# Patient Record
Sex: Female | Born: 1960 | Race: Black or African American | Hispanic: No | Marital: Married | Smoking: Former smoker
Health system: Southern US, Community
[De-identification: ages and names within clinical notes are randomized; demographics above are authoritative.]

## PROBLEM LIST (undated history)

## (undated) DIAGNOSIS — K219 Gastro-esophageal reflux disease without esophagitis: Secondary | ICD-10-CM

## (undated) DIAGNOSIS — Z780 Asymptomatic menopausal state: Secondary | ICD-10-CM

## (undated) DIAGNOSIS — I10 Essential (primary) hypertension: Secondary | ICD-10-CM

## (undated) DIAGNOSIS — R7303 Prediabetes: Secondary | ICD-10-CM

## (undated) DIAGNOSIS — E782 Mixed hyperlipidemia: Secondary | ICD-10-CM

## (undated) HISTORY — DX: Essential (primary) hypertension: I10

## (undated) HISTORY — DX: Gastro-esophageal reflux disease without esophagitis: K21.9

## (undated) HISTORY — DX: Mixed hyperlipidemia: E78.2

## (undated) HISTORY — DX: Prediabetes: R73.03

## (undated) HISTORY — DX: Asymptomatic menopausal state: Z78.0

## (undated) HISTORY — PX: HERNIA REPAIR: SHX51

---

## 1999-07-18 ENCOUNTER — Ambulatory Visit (HOSPITAL_COMMUNITY): Admission: RE | Admit: 1999-07-18 | Discharge: 1999-07-18 | Payer: Self-pay | Admitting: Internal Medicine

## 1999-07-18 ENCOUNTER — Encounter: Payer: Self-pay | Admitting: Internal Medicine

## 2000-01-22 ENCOUNTER — Other Ambulatory Visit: Admission: RE | Admit: 2000-01-22 | Discharge: 2000-01-22 | Payer: Self-pay | Admitting: Internal Medicine

## 2000-04-02 ENCOUNTER — Encounter: Payer: Self-pay | Admitting: Internal Medicine

## 2000-04-02 ENCOUNTER — Ambulatory Visit (HOSPITAL_COMMUNITY): Admission: RE | Admit: 2000-04-02 | Discharge: 2000-04-02 | Payer: Self-pay | Admitting: Internal Medicine

## 2000-04-06 ENCOUNTER — Ambulatory Visit (HOSPITAL_COMMUNITY): Admission: RE | Admit: 2000-04-06 | Discharge: 2000-04-06 | Payer: Self-pay | Admitting: Internal Medicine

## 2000-04-06 ENCOUNTER — Encounter: Payer: Self-pay | Admitting: Internal Medicine

## 2000-06-23 ENCOUNTER — Ambulatory Visit (HOSPITAL_COMMUNITY): Admission: RE | Admit: 2000-06-23 | Discharge: 2000-06-23 | Payer: Self-pay | Admitting: Neurology

## 2001-07-17 ENCOUNTER — Other Ambulatory Visit: Admission: RE | Admit: 2001-07-17 | Discharge: 2001-07-17 | Payer: Self-pay | Admitting: Family Medicine

## 2003-01-05 ENCOUNTER — Emergency Department (HOSPITAL_COMMUNITY): Admission: EM | Admit: 2003-01-05 | Discharge: 2003-01-06 | Payer: Self-pay | Admitting: Emergency Medicine

## 2003-01-06 ENCOUNTER — Encounter: Payer: Self-pay | Admitting: Emergency Medicine

## 2005-07-16 ENCOUNTER — Emergency Department (HOSPITAL_COMMUNITY): Admission: EM | Admit: 2005-07-16 | Discharge: 2005-07-16 | Payer: Self-pay | Admitting: Emergency Medicine

## 2006-01-06 ENCOUNTER — Ambulatory Visit: Payer: Self-pay | Admitting: Internal Medicine

## 2006-01-09 ENCOUNTER — Ambulatory Visit: Payer: Self-pay | Admitting: Internal Medicine

## 2006-06-18 ENCOUNTER — Other Ambulatory Visit: Admission: RE | Admit: 2006-06-18 | Discharge: 2006-06-18 | Payer: Self-pay | Admitting: Obstetrics and Gynecology

## 2006-08-18 ENCOUNTER — Ambulatory Visit: Payer: Self-pay | Admitting: Internal Medicine

## 2006-09-19 ENCOUNTER — Ambulatory Visit: Payer: Self-pay | Admitting: Internal Medicine

## 2006-09-19 LAB — CONVERTED CEMR LAB
BUN: 11 mg/dL (ref 6–23)
Chol/HDL Ratio, serum: 4.6
Cholesterol: 166 mg/dL (ref 0–200)
Creatinine, Ser: 0.8 mg/dL (ref 0.4–1.2)
Creatinine,U: 179.1 mg/dL
HDL: 36.4 mg/dL — ABNORMAL LOW (ref >39.0)
Hgb A1c MFr Bld: 5.8 % (ref 4.6–6.0)
LDL Cholesterol: 120 mg/dL — ABNORMAL HIGH (ref 0–99)
Microalb Creat Ratio: 2.8 mg/g (ref 0.0–30.0)
Microalb, Ur: 0.5 mg/dL (ref 0.0–1.9)
Potassium: 4.1 meq/L (ref 3.5–5.1)
Triglyceride fasting, serum: 47 mg/dL (ref 0–149)
VLDL: 9 mg/dL (ref 0–40)

## 2006-10-07 ENCOUNTER — Ambulatory Visit: Payer: Self-pay | Admitting: Internal Medicine

## 2007-05-19 ENCOUNTER — Ambulatory Visit: Payer: Self-pay | Admitting: Internal Medicine

## 2007-06-02 ENCOUNTER — Telehealth (INDEPENDENT_AMBULATORY_CARE_PROVIDER_SITE_OTHER): Payer: Self-pay | Admitting: *Deleted

## 2007-06-02 DIAGNOSIS — R002 Palpitations: Secondary | ICD-10-CM | POA: Insufficient documentation

## 2007-06-29 ENCOUNTER — Ambulatory Visit: Payer: Self-pay | Admitting: Cardiology

## 2007-06-29 LAB — CONVERTED CEMR LAB: TSH: 1.04 microintl units/mL (ref 0.35–5.50)

## 2007-07-09 ENCOUNTER — Ambulatory Visit: Payer: Self-pay

## 2007-09-04 ENCOUNTER — Ambulatory Visit: Payer: Self-pay | Admitting: Cardiology

## 2007-09-15 ENCOUNTER — Telehealth (INDEPENDENT_AMBULATORY_CARE_PROVIDER_SITE_OTHER): Payer: Self-pay | Admitting: *Deleted

## 2007-11-09 ENCOUNTER — Telehealth: Payer: Self-pay | Admitting: Internal Medicine

## 2007-11-11 ENCOUNTER — Ambulatory Visit: Payer: Self-pay | Admitting: Cardiology

## 2007-11-23 ENCOUNTER — Ambulatory Visit: Payer: Self-pay | Admitting: Cardiology

## 2008-01-25 ENCOUNTER — Ambulatory Visit: Payer: Self-pay | Admitting: Cardiology

## 2008-01-25 LAB — CONVERTED CEMR LAB
BUN: 10 mg/dL (ref 6–23)
CO2: 29 meq/L (ref 19–32)
Calcium: 9.1 mg/dL (ref 8.4–10.5)
Chloride: 107 meq/L (ref 96–112)
Creatinine, Ser: 0.7 mg/dL (ref 0.4–1.2)
GFR calc Af Amer: 116 mL/min
GFR calc non Af Amer: 96 mL/min
Glucose, Bld: 106 mg/dL — ABNORMAL HIGH (ref 70–99)
Potassium: 3.8 meq/L (ref 3.5–5.1)
Sodium: 141 meq/L (ref 135–145)

## 2008-02-16 ENCOUNTER — Encounter: Payer: Self-pay | Admitting: Internal Medicine

## 2008-03-29 ENCOUNTER — Telehealth (INDEPENDENT_AMBULATORY_CARE_PROVIDER_SITE_OTHER): Payer: Self-pay | Admitting: *Deleted

## 2008-04-22 ENCOUNTER — Ambulatory Visit: Payer: Self-pay | Admitting: Internal Medicine

## 2008-04-22 DIAGNOSIS — Z8679 Personal history of other diseases of the circulatory system: Secondary | ICD-10-CM | POA: Insufficient documentation

## 2008-04-22 DIAGNOSIS — E8881 Metabolic syndrome: Secondary | ICD-10-CM

## 2008-04-22 DIAGNOSIS — I10 Essential (primary) hypertension: Secondary | ICD-10-CM | POA: Insufficient documentation

## 2008-05-03 ENCOUNTER — Telehealth: Payer: Self-pay | Admitting: Internal Medicine

## 2008-05-03 ENCOUNTER — Encounter (INDEPENDENT_AMBULATORY_CARE_PROVIDER_SITE_OTHER): Payer: Self-pay | Admitting: *Deleted

## 2008-05-03 LAB — CONVERTED CEMR LAB
BUN: 12 mg/dL (ref 6–23)
Creatinine, Ser: 0.86 mg/dL (ref 0.40–1.20)
Hgb A1c MFr Bld: 6 % (ref 4.6–6.1)
Potassium: 3.9 meq/L (ref 3.5–5.3)

## 2008-05-16 ENCOUNTER — Telehealth (INDEPENDENT_AMBULATORY_CARE_PROVIDER_SITE_OTHER): Payer: Self-pay | Admitting: *Deleted

## 2009-03-02 ENCOUNTER — Ambulatory Visit: Payer: Self-pay | Admitting: Family Medicine

## 2009-03-02 DIAGNOSIS — H109 Unspecified conjunctivitis: Secondary | ICD-10-CM | POA: Insufficient documentation

## 2010-01-19 ENCOUNTER — Ambulatory Visit: Payer: Self-pay | Admitting: Diagnostic Radiology

## 2010-10-11 ENCOUNTER — Emergency Department (HOSPITAL_BASED_OUTPATIENT_CLINIC_OR_DEPARTMENT_OTHER): Admission: EM | Admit: 2010-10-11 | Discharge: 2010-01-19 | Payer: Self-pay | Admitting: Emergency Medicine

## 2011-01-28 LAB — COMPREHENSIVE METABOLIC PANEL
ALT: 20 U/L (ref 0–35)
AST: 42 U/L — ABNORMAL HIGH (ref 0–37)
Albumin: 3.5 g/dL (ref 3.5–5.2)
Alkaline Phosphatase: 81 U/L (ref 39–117)
CO2: 29 mEq/L (ref 19–32)
Chloride: 108 mEq/L (ref 96–112)
GFR calc Af Amer: >60 mL/min (ref >60)
GFR calc non Af Amer: >60 mL/min (ref >60)
Potassium: 4.1 mEq/L (ref 3.5–5.1)
Sodium: 145 mEq/L (ref 135–145)
Total Bilirubin: 0.8 mg/dL (ref 0.3–1.2)

## 2011-01-28 LAB — CBC
Platelets: 233 10*3/uL (ref 150–400)
RBC: 4.62 MIL/uL (ref 3.87–5.11)
WBC: 5.8 10*3/uL (ref 4.0–10.5)

## 2011-01-28 LAB — DIFFERENTIAL
Basophils Absolute: 0.1 10*3/uL (ref 0.0–0.1)
Basophils Relative: 1 % (ref 0–1)
Eosinophils Absolute: 0.3 10*3/uL (ref 0.0–0.7)
Eosinophils Relative: 5 % (ref 0–5)
Lymphocytes Relative: 42 % (ref 12–46)
Monocytes Absolute: 0.3 10*3/uL (ref 0.1–1.0)

## 2011-01-28 LAB — POCT CARDIAC MARKERS: CKMB, poc: <1 ng/mL — ABNORMAL LOW (ref 1.0–8.0)

## 2011-03-19 NOTE — Assessment & Plan Note (Signed)
[facility] HEALTHCARE                            CARDIOLOGY OFFICE NOTE   Margaret Mcmahon, Margaret Mcmahon                       MRN:          [id number]  DATE:11/23/2007                            DOB:          02-07-1961    Margaret Mcmahon is a pleasant 50 year old female whom I have seen for  palpitations.  Her LV function is normal and previous TSH was normal.  She also has significant hypertension that we are treating.  Since I  last saw her she feels well with no dyspnea, chest pain, palpitations,  or syncope.  She is checking her blood pressure routinely at home, and  her systolic is in 150-170 range and her diastolic is approximately 100.   MEDICATIONS INCLUDE:  1. Lisinopril 40 mg daily.  2. Spironolactone 25 mg p.o. b.i.d.  3. Aspirin 81 mg daily.  4. Toprol 50 mg p.o. b.i.d.   PHYSICAL EXAM:  Today shows a blood pressure of 163/99 and pulse of 63.  She weighs 108 pounds.  HEENT:  Normal.  NECK:  Supple.  CHEST:  Clear.  CARDIOVASCULAR EXAM:  Regular rhythm.  ABDOMINAL EXAM:  Shows no tenderness.  EXTREMITIES:  Show no edema.   DIAGNOSES:  1. Palpitations.  These have improved on the Toprol.  2. Hypertension.  Her blood pressure remains elevated.  We will      continue with her lisinopril, spironolactone, and Toprol.   NOTE:  I cannot increase her Toprol further as her heart rate is in the  65 range.  We will instead add Norvasc 5 mg p.o. daily.  We can increase  to 10 mg if needed.  I will also check renal Dopplers to exclude renal  artery stenosis; although, I think, that this is unlikely, since she has  had hypertension for on extended amount of time.  We will also have a  BMET check when she returns, as she did not have this drawn previously,  and she is on spironolactone and lisinopril.  We also discussed, again,  lifestyle modification including low-sodium diet, exercise, weight loss  and avoiding alcohol use (she does not use alcohol at present).  We  will  see her back in approximately 3 months.  I have asked her to contact us  if her systolic pressure remains greater than 130 and her diastolic  greater than 5, and we will increase her Norvasc to 10 mg at that time.    Madolyn Frieze Jens Som, MD, [facility]  Electronically Signed   BSC/MedQ  DD: 11/23/2007  DT: 11/24/2007  Job #: [id number]

## 2011-03-19 NOTE — Assessment & Plan Note (Signed)
[facility] HEALTHCARE                            CARDIOLOGY OFFICE NOTE   CORYNN, SOLBERG                       MRN:          [id number]  DATE:06/29/2007                            DOB:          07/23/1961    Ms. Campus is a very pleasant 50 year old female who I am asked to consult  on concerning palpitations.   The patient has no prior cardiac history.  She does have mild dyspnea on  exertion but attributes this to her size.  There is no orthopnea, PND,  pedal edema, presyncope, syncope, or exertional chest pain.  For several  years she has had intermittent palpitations.  These are occurring  predominantly at night, but they can occur during the day.  They are  sudden in onset and described as her heart racing.  They slowly  resolve over 1-2 minutes.  There is no associated chest pain, shortness  of breath, or syncope.  Because of her palpitations, we were asked to  further evaluate.   PRESENT MEDICATIONS:  1. Cartia 300 mg p.o. daily.  2. Lisinopril 40 mg p.o. daily.  3. Spironolactone 25 mg p.o. b.i.d.  4. Aspirin 81 mg p.o. daily.   She has an allergy to CODEINE.   SOCIAL HISTORY:  She does not smoke nor does she consume alcohol.   Her family history is negative for coronary artery disease or sudden  death.   Her past medical history is significant for hypertension, but there is  no diabetes mellitus or hyperlipidemia.  She has had a previous hernia  repair.   REVIEW OF SYSTEMS:  She denies any headaches, fevers or chills.  There  is no productive cough or hemoptysis.  There is no dysphagia,  odynophagia, melena, or hematochezia.  There is no dysuria or hematuria.  There is no rash or seizure activity.  There is no orthopnea, PND, or  pedal edema.  The remaining systems are negative.   PHYSICAL EXAMINATION:  She weighs 212 pounds.  Her blood pressure is  121/86.  Her pulse is 59.  She is well-developed and somewhat obese.  She is in no  acute distress.  Her skin is warm and dry.  She does not appear to be depressed.  There is no peripheral clubbing.  BACK:  Normal.  HEENT:  Normal with normal eyelids.  NECK:  Supple with a normal upstroke bilaterally, and I cannot  appreciate bruits.  There is no jugular venous distention.  No  thyromegaly is noted.  CHEST:  Clear to auscultation with normal expansion.  CARDIAC:  Regular rate and rhythm with a normal S1 and S2.  There are no  murmurs, rubs or gallops noted.  There is no change with Valsalva.  Her  PMI is nondisplaced.  ABDOMEN:  Nontender, nondistended.  Positive bowel sounds.  No  hepatosplenomegaly and no masses appreciated.  There is no abdominal  bruit.  She has 2+ pedal pulses bilaterally and no bruits.  EXTREMITIES:  No edema.  I can palpate no cords.  She has 2+ posterior  tibial pulses bilaterally.  NEUROLOGIC:  Grossly  intact.   Her electrocardiogram shows a sinus rhythm at a rate of 61.  There are  no ST changes noted.   DIAGNOSES:  1. Palpitations:  The etiology of this is not clear to me.  I wonder      whether she may be having an supraventricular tachycardia.  We will      plan to proceed with an event monitor.  We will also schedule her      to have an echocardiogram to quantify her left ventricular function      and also a TSH.  I will see her back in 4-6 weeks to review the      above information.  2. Hypertension:  Her blood pressure is well controlled on her present      medications; however, we may change her Cardizem to a beta blocker      in the future if her palpitations persist or they worsen, pending      the results of her event monitor.   We will see her back in approximately 4-6 weeks.     Madolyn Frieze Jens Som, MD, [facility]  Electronically Signed    BSC/MedQ  DD: 06/29/2007  DT: 06/29/2007  Job #: [id number]   cc:   Titus Dubin. Alwyn Ren, MD,FACP,FCCP

## 2011-03-19 NOTE — Assessment & Plan Note (Signed)
[facility] HEALTHCARE                                 ON-CALL NOTE   OANH, DEVIVO                         MRN:          [id number]  DATE:11/09/2007                            DOB:          June 21, 1961    Phone number [phone].  Primary is Dr. Alwyn Ren.  Phone call is at [phone].   SUBJECTIVE:  Elevated blood pressure, diastolic 119.   ASSESSMENT AND PLAN:  We reviewed her blood pressure medications  including spironolactone, lisinopril and metoprolol ER.  She had some  palpitations earlier but no headache.  She does have some mild numbness  in her face but nowhere else.  No confusion, no slurred speech and no  weakness of muscles.  We will have her take an extra half-tablet of  metoprolol for a total of 75 mg today.  She will check her blood  pressure and pulse within 2 hours.  She will contact her regular doctor  in the morning to determine what to do for her blood pressure in the  long run.     Kerby Nora, MD  Electronically Signed    AB/MedQ  DD: 11/09/2007  DT: 11/09/2007  Job #: [id number]

## 2011-03-19 NOTE — Assessment & Plan Note (Signed)
[facility] HEALTHCARE                            CARDIOLOGY OFFICE NOTE   Margaret Mcmahon, Margaret Mcmahon                       MRN:          [id number]  DATE:09/04/2007                            DOB:          10-18-1961    Margaret Mcmahon is a pleasant 50 year old female who I recently saw for  palpitations. We did schedule an echocardiogram which was performed on  07/09/2007. Her LV function was normal. There was trivial mitral  regurgitation. There was mild left atrial enlargement. She also had a  TSH that was normal at 1.04. We also performed a monitor that showed no  significant rhythm disturbance. She continues to have occasional  palpitations that are described as her heart racing. This lasts for  several minutes and resolves. There is no chest pain, shortness of  breath, or syncope. There is no pedal edema.   MEDICATIONS:  1. Cardia 300 mg daily.  2. Lisinopril 40 mg daily.  3. Spironolactone 25 mg b.i.d.  4. Aspirin 81 mg daily.   PHYSICAL EXAMINATION:  VITAL SIGNS:  Blood pressure 136/90, pulse 70.  She weighs 207 pounds.  HEENT:  Normal.  NECK:  Supple.  CHEST:  Clear.  CARDIOVASCULAR:  Regular rate and rhythm.  ABDOMEN:  Shows no tenderness.  EXTREMITIES:  No edema.   DIAGNOSES:  1. Continued palpitations - I do not find any significant arrhythmia      at this point. We will plan to discontinue her Cardizem and begin      Toprol 50 mg p.o. daily to see if this improves her symptoms.  2. Hypertension - as above, we will discontinue Cardizem and begin a      beta blocker. We may need to increase this or add additional      medications in the future. I have asked her to track her blood      pressure at home and keep records for [place]. I will see her back in      six weeks and we will adjust as indicated.     Madolyn Frieze Jens Som, MD, [facility]  Electronically Signed    BSC/MedQ  DD: 09/04/2007  DT: 09/06/2007  Job #: [id number]   cc:   Titus Dubin. Alwyn Ren,  MD,FACP,FCCP

## 2011-03-19 NOTE — Assessment & Plan Note (Signed)
[facility] HEALTHCARE                            CARDIOLOGY OFFICE NOTE   Margaret Mcmahon, Margaret Mcmahon                       MRN:          [id number]  DATE:11/11/2007                            DOB:          1960/11/17    Margaret Mcmahon is a very pleasant 50 year old female who I have recently seen  for palpitations.  Her LV function is normal and her TSH is normal.  When I last saw her on September 04, 2007 we discontinue her Cardizem and  began Toprol 50 mg p.o. daily.  Her palpitations have improved, but her  blood pressure is increased with numbers as high as 160/110.  We  increase her Toprol 75 mg p.o. daily which she just began today.  She  otherwise is doing well with no chest pain, shortness of breath,  palpitations or pedal edema.  She is not having a headache.   MEDICATIONS:  1. Lisinopril 40 mg daily.  2. Spironolactone 25 mg p.o. b.i.d.  3. Aspirin 81 mg daily.  4. Toprol 75 mg daily.   PHYSICAL EXAM:  Today shows a blood pressure of 140/95 and pulse is 65.  She was 218 pounds.  Her HEENT is normal.  Her neck is supple.  CHEST:  Clear.  CARDIOVASCULAR:  Regular rate and rhythm.  ABDOMEN:  Shows no tenderness.  EXTREMITIES:  Show no edema.   Electrocardiogram shows sinus rhythm at a rate of 62.  There are no  significant ST changes.   DIAGNOSIS:  1. Palpitations - These have resolved on Toprol and we will continue.  2. Hypertension - Her blood pressure is elevated today.  I have asked      her to increase the Toprol to 100 mg p.o. daily.  She will continue      to track her blood pressure at home.  When she returns on February      19, she will bring her blood pressure cuff with her and we will      correlate this.  If necessary will add Norvasc at that time.  Note,      I will  also check BMET at that time given her spironolactone and      lisinopril use.  If a blood pressure becomes more difficult to      control then we can consider renal Dopplers in  the future.  She      also will continue with lifestyle modification including low-sodium      diet.   NOTE:  She does not smoke.     Margaret Mcmahon Jens Som, MD, [facility]  Electronically Signed   BSC/MedQ  DD: 11/11/2007  DT: 11/11/2007  Job #: [id number]

## 2011-03-22 NOTE — Procedures (Signed)
[place]  Patient:    Margaret Mcmahon, Margaret Mcmahon                         MRN: [id number] Proc. Date: 06/23/00 Adm. Date:  [id number] Attending:  Lesly Dukes CC:         [facility]   Procedure Report  PROCEDURE PERFORMED:  Lumbar puncture.  OPERATOR:  Marlan Palau, M.D.  HISTORY:  This is a 50 year old patient with an abnormal MRI scan of the brain with white matter changes.  The patient is being evaluated for possible problem with demyelinating disease versus ischemic disease.  The patient does have a history of hypertension.  The patient was placed in the fetal position on the right side.  The low back was painted with Betadine solution.  2 cc of 1% Xylocaine was used as local anesthetic.  A 20 gauge spinal needle was inserted into the L3-4 interspace and approximately 16 cc of clear, colorless spinal fluid were removed for testing.  Opening pressure was 140 mmHg.  Tube #1 was sent for VDRL and cryptococcal antigen.  Tube #2 was sent for oligoclonal banding and IgG albumin ratio.  Tube #3 was sent for cells, differential, glucose, protein. Tube #4 was sent for Lymes panel.  Blood work was also obtained to include a homocysteine level and antiphospholipid antibody panel, protein S, protein C levels, antithrombin 3 level, B12 and folate level.  No complications of the above procedure were noted.  The patient tolerated the procedure well but did complain of some back pain during the procedure.  No pain radiated down the leg. DD:  06/23/00 TD:  06/23/00 Job: [id number]

## 2011-03-29 IMAGING — CR DG CHEST 2V
2 series · 2 of 2 positions shown · non-contrast
Comparison: None.

CLINICAL DATA: 48-year-old female with chest pain.

CHEST - 2 VIEW

[w chest pa]
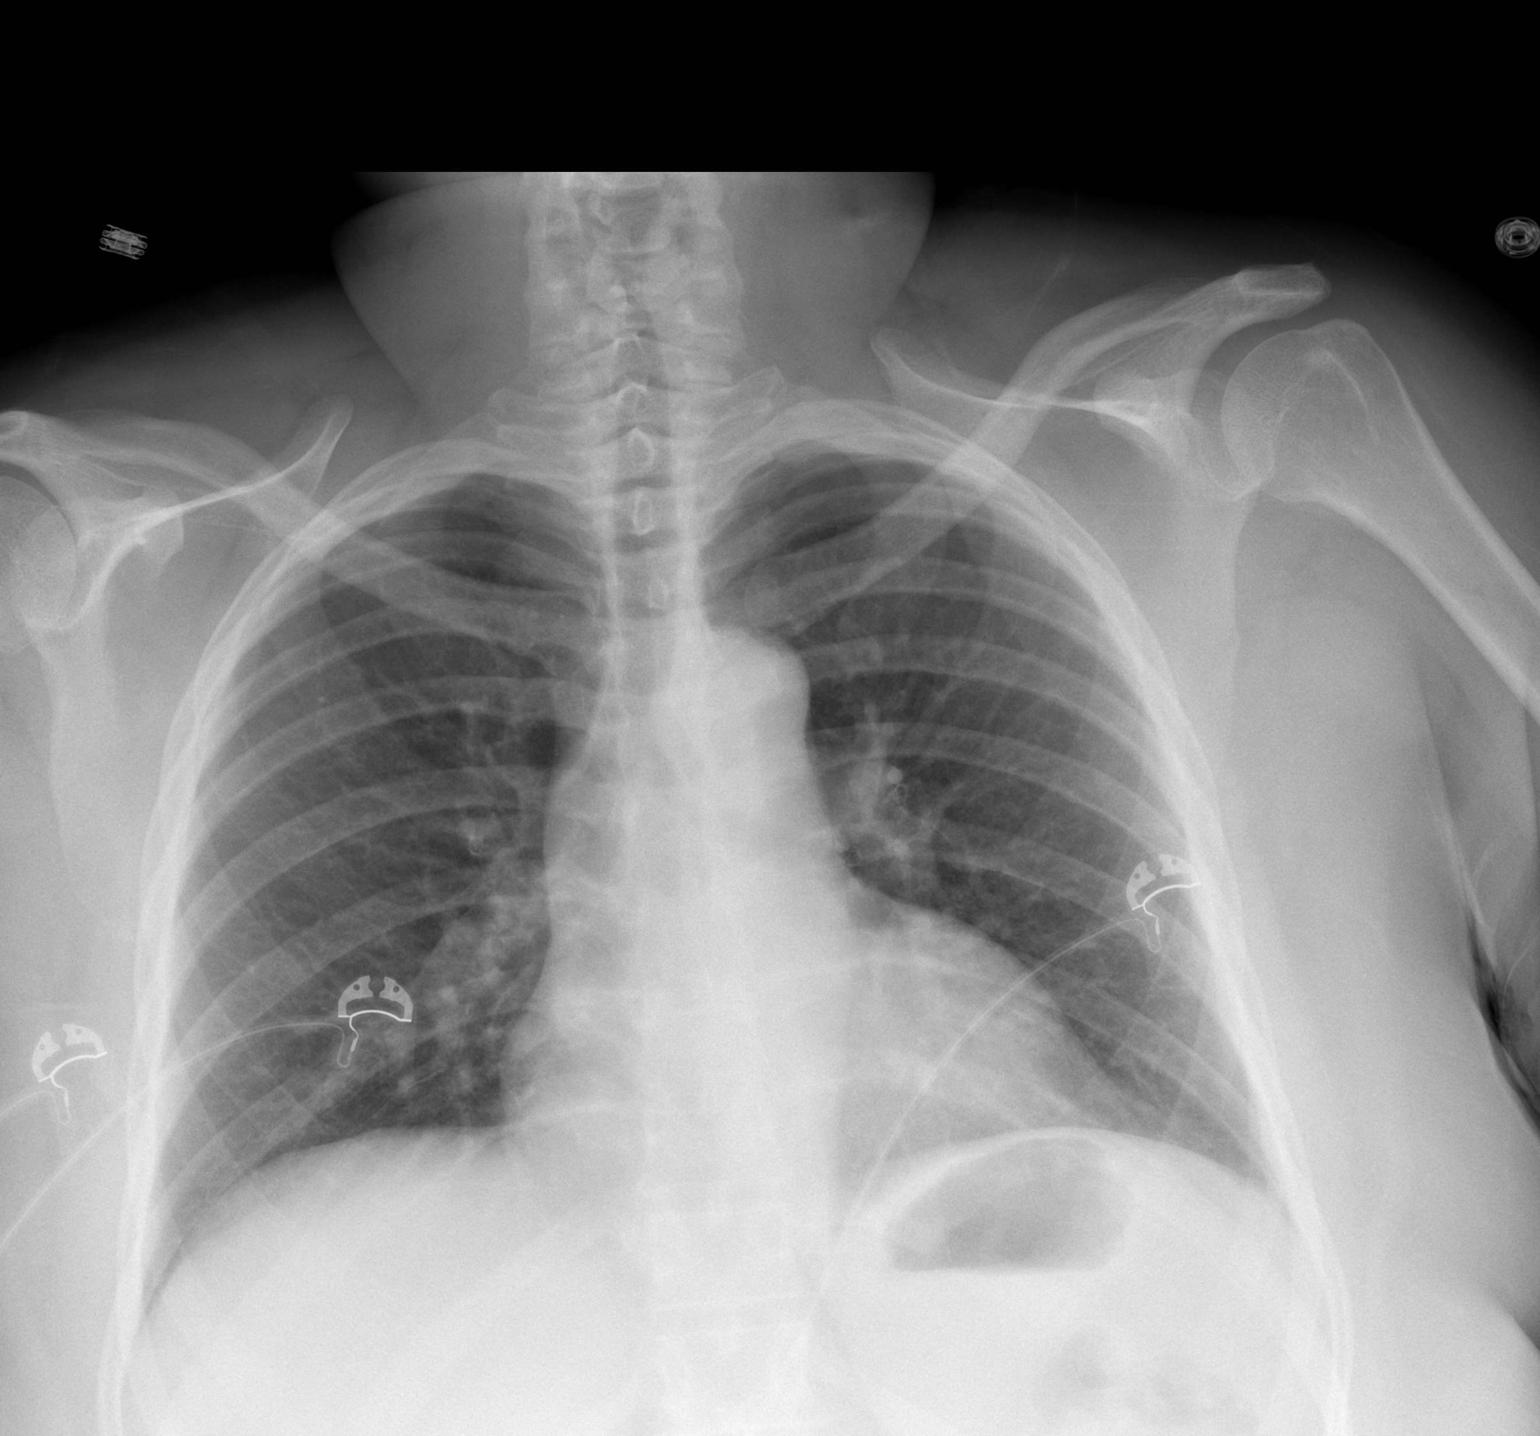

[w chest lat]
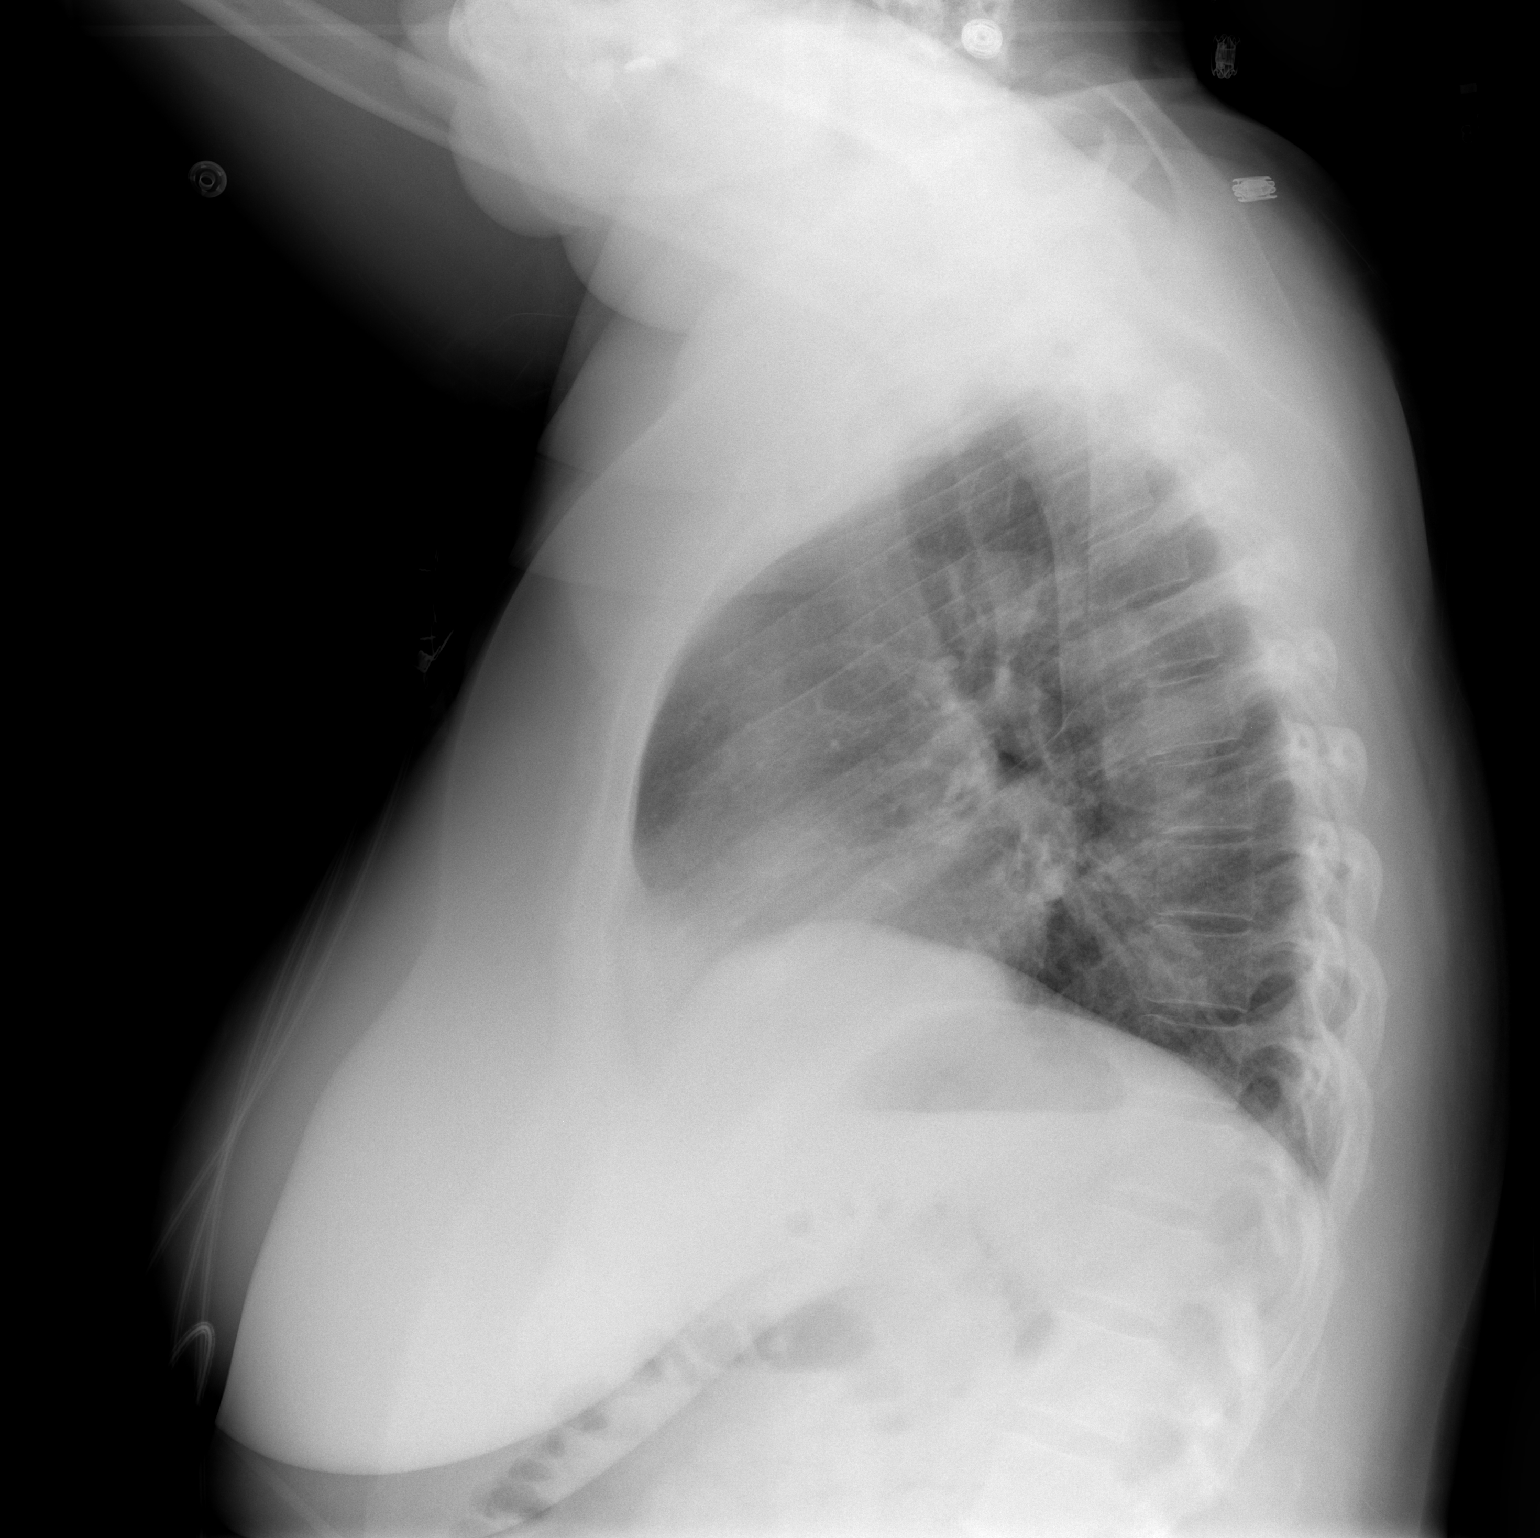

[2 of 2 positions shown; findings below may reference images not displayed]

FINDINGS: Low lung volumes.  Cardiac size and mediastinal contours
are within normal limits.  Visualized tracheal air column is within
normal limits.  No pneumothorax.  The lungs are clear. No acute
osseous abnormality identified.
IMPRESSION: Low lung volumes, otherwise no acute cardiopulmonary abnormality.

## 2018-03-03 ENCOUNTER — Other Ambulatory Visit: Payer: Self-pay

## 2018-03-03 DIAGNOSIS — I83811 Varicose veins of right lower extremities with pain: Secondary | ICD-10-CM

## 2018-05-06 ENCOUNTER — Encounter

## 2018-05-06 ENCOUNTER — Other Ambulatory Visit: Payer: Self-pay

## 2018-05-06 ENCOUNTER — Ambulatory Visit (INDEPENDENT_AMBULATORY_CARE_PROVIDER_SITE_OTHER): Payer: BC Managed Care – PPO | Admitting: Vascular Surgery

## 2018-05-06 ENCOUNTER — Ambulatory Visit (HOSPITAL_COMMUNITY)
Admission: RE | Admit: 2018-05-06 | Discharge: 2018-05-06 | Disposition: A | Discharge status: Home / Self Care | Payer: BC Managed Care – PPO | Source: Ambulatory Visit | Attending: Vascular Surgery | Admitting: Vascular Surgery

## 2018-05-06 ENCOUNTER — Encounter: Payer: Self-pay | Admitting: Vascular Surgery

## 2018-05-06 VITALS — BP 168/92 | HR 54 | Temp 98.7°F | Resp 16 | Ht 61.0 in | Wt 204.0 lb

## 2018-05-06 DIAGNOSIS — R599 Enlarged lymph nodes, unspecified: Secondary | ICD-10-CM | POA: Diagnosis not present

## 2018-05-06 DIAGNOSIS — I872 Venous insufficiency (chronic) (peripheral): Secondary | ICD-10-CM | POA: Diagnosis not present

## 2018-05-06 DIAGNOSIS — I83811 Varicose veins of right lower extremities with pain: Secondary | ICD-10-CM | POA: Diagnosis not present

## 2018-05-06 NOTE — Progress Notes (Signed)
Patient name: Margaret Burdocklmelia E Paolo MRN: [id number] DOB: [birth year] Sex: female   REASON FOR CONSULT:    Varicose veins right lower extremity.  The consult is requested by Dr. Caffie DammeKarla Smith  HPI:   Margaret Mcmahon is a pleasant 57 y.o. female, referred with varicose veins of the right lower extremity.  I have reviewed the records from the referring office.  The patient was seen on 03/02/2018.  The patient has a history of Raynaud's phenomenon.  Patient was noted to have varicose veins and was sent for referral.  The patient also has benign essential hypertension which has been under good control and hyperlipidemia which is also been under good control.  On my history, the patient notes a 1 year history of the gradual onset of pain in both legs but especially the right side.  She experiences aching pain, heaviness, and throbbing in the right calf.  Her symptoms are aggravated by standing.  She thinks that heat helps her symptoms some.  She has not tried leg elevation and has not tried compression stockings.  I do not get any history of claudication, rest pain, or nonhealing ulcers.  She is unaware of any previous history of DVT or phlebitis.  Past Medical History:  Diagnosis Date  . Acid reflux   . Benign essential hypertension   . Hyperlipemia, mixed   . Menopause   . Prediabetes     Family History  Problem Relation Age of Onset  . Cancer Mother   . Hypertension Mother     SOCIAL HISTORY: Social History   Socioeconomic History  . Marital status: Married    Spouse name: Not on file  . Number of children: Not on file  . Years of education: Not on file  . Highest education level: Not on file  Occupational History  . Not on file  Social Needs  . Financial resource strain: Not on file  . Food insecurity:    Worry: Not on file    Inability: Not on file  . Transportation needs:    Medical: Not on file    Non-medical: Not on file  Tobacco Use  . Smoking status: Former Games developermoker  .  Smokeless tobacco: Never Used  Substance and Sexual Activity  . Alcohol use: Never    Frequency: Never  . Drug use: Never  . Sexual activity: Not on file  Lifestyle  . Physical activity:    Days per week: Not on file    Minutes per session: Not on file  . Stress: Not on file  Relationships  . Social connections:    Talks on phone: Not on file    Gets together: Not on file    Attends religious service: Not on file    Active member of club or organization: Not on file    Attends meetings of clubs or organizations: Not on file    Relationship status: Not on file  . Intimate partner violence:    Fear of current or ex partner: Not on file    Emotionally abused: Not on file    Physically abused: Not on file    Forced sexual activity: Not on file  Other Topics Concern  . Not on file  Social History Narrative  . Not on file    Allergies  Allergen Reactions  . Codeine Other (See Comments)    PT. REPORTS IT MAKES HER "FEEL HIGH" PT. REPORTS IT MAKES HER "FEEL HIGH"     Current Outpatient Medications  Medication Sig Dispense Refill  . aspirin EC 81 MG tablet Take 81 mg by mouth daily.    . cholecalciferol (VITAMIN D) 1000 units tablet Take 2,000 Units by mouth daily.    . furosemide (LASIX) 20 MG tablet Take 20 mg by mouth daily.    Marland Kitchen losartan (COZAAR) 50 MG tablet Take 50 mg by mouth daily.    . magnesium oxide (MAG-OX) 400 MG tablet Take 400 mg by mouth daily.    . metoprolol succinate (TOPROL-XL) 100 MG 24 hr tablet Take 100 mg by mouth daily. Take with or immediately following a meal.    . albuterol (PROVENTIL HFA;VENTOLIN HFA) 108 (90 Base) MCG/ACT inhaler Inhale into the lungs 4 (four) times daily as needed for wheezing or shortness of breath.     No current facility-administered medications for this visit.     REVIEW OF SYSTEMS:  [X]  denotes positive finding, [ ]  denotes negative finding Cardiac  Comments:  Chest pain or chest pressure:    Shortness of breath upon  exertion:    Short of breath when lying flat:    Irregular heart rhythm:        Vascular    Pain in calf, thigh, or hip brought on by ambulation: x   Pain in feet at night that wakes you up from your sleep:     Blood clot in your veins:    Leg swelling:         Pulmonary    Oxygen at home:    Productive cough:     Wheezing:         Neurologic    Sudden weakness in arms or legs:     Sudden numbness in arms or legs:     Sudden onset of difficulty speaking or slurred speech:    Temporary loss of vision in one eye:     Problems with dizziness:         Gastrointestinal    Blood in stool:     Vomited blood:         Genitourinary    Burning when urinating:     Blood in urine:        Psychiatric    Major depression:         Hematologic    Bleeding problems:    Problems with blood clotting too easily:        Skin    Rashes or ulcers:        Constitutional    Fever or chills:     PHYSICAL EXAM:   Vitals:   05/06/18 1410 05/06/18 1419  BP: (!) 179/92 (!) 168/92  Pulse: (!) 54 (!) 54  Resp: 16   Temp: 98.7 F (37.1 C)   TempSrc: Oral   SpO2: 98%   Weight: 204 lb (92.5 kg)   Height: 5\' 1"  (1.549 m)     GENERAL: The patient is a well-nourished female, in no acute distress. The vital signs are documented above. CARDIAC: There is a regular rate and rhythm.  VASCULAR: I do not detect carotid bruits. She has palpable femoral, dorsalis pedis, and posterior tibial pulses bilaterally. 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  She also has some hyperpigmentation in her anterior left leg. She has mild bilateral lower extremity swelling. PULMONARY: There is good air exchange bilaterally without wheezing or rales. ABDOMEN: Soft and non-tender with normal pitched bowel sounds.  MUSCULOSKELETAL: There  are no major deformities or cyanosis. NEUROLOGIC: No focal weakness or paresthesias are  detected. SKIN: There are no ulcers or rashes noted. PSYCHIATRIC: The patient has a normal affect.  DATA:    VENOUS DUPLEX: I have independently interpreted the venous duplex of the right lower extremity.  There is no evidence of DVT or superficial venous thrombosis on the right.  There is deep venous reflux involving the common femoral vein.  There is superficial venous reflux involving the great saphenous vein in the proximal thigh and mid calf.  There is no reflux noted in the short saphenous vein.  The vein is slightly dilated.  MEDICAL ISSUES:   CHRONIC VENOUS INSUFFICIENCY: Based on her venous duplex scan and physical exam she has evidence of significant chronic venous insufficiency.  She has CEAP, clinical stage 4b disease.  We have discussed the importance of intermittent leg elevation and the proper positioning for this.  I have also written her a prescription for knee-high compression stockings with a gradient of 15 to 20 mmHg.  I have encouraged her to avoid prolonged sitting and standing.  I encouraged her to exercise.  We have also discussed the importance of weight management as central obesity especially increases venous pressure.  If she does not adhere to the simple lifestyle changes that I think this could potentially develop into a venous ulcer.  The treatment for this would be the exactly the same.  Currently she does not appear to be a candidate for laser ablation of the saphenous vein on the right.  There is only a short segment of incompetence in the proximal thigh and in the mid calf.  I will be happy to see the patient back at any time if her symptoms progress.  Waverly Ferrari [facility] [phone]

## 2018-05-06 NOTE — Progress Notes (Signed)
Vitals:   05/06/18 1410  BP: (!) 179/92  Pulse: (!) 54  Resp: 16  Temp: 98.7 F (37.1 C)  TempSrc: Oral  SpO2: 98%  Weight: 204 lb (92.5 kg)  Height: 5\' 1"  (1.549 m)

## 2019-01-01 ENCOUNTER — Other Ambulatory Visit: Payer: Self-pay

## 2019-01-01 DIAGNOSIS — I83811 Varicose veins of right lower extremities with pain: Secondary | ICD-10-CM

## 2019-01-01 DIAGNOSIS — I872 Venous insufficiency (chronic) (peripheral): Secondary | ICD-10-CM

## 2019-01-06 ENCOUNTER — Ambulatory Visit (INDEPENDENT_AMBULATORY_CARE_PROVIDER_SITE_OTHER): Payer: BC Managed Care – PPO | Admitting: Vascular Surgery

## 2019-01-06 ENCOUNTER — Other Ambulatory Visit: Payer: Self-pay

## 2019-01-06 ENCOUNTER — Ambulatory Visit (HOSPITAL_COMMUNITY)
Admission: RE | Admit: 2019-01-06 | Discharge: 2019-01-06 | Disposition: A | Discharge status: Home / Self Care | Payer: BC Managed Care – PPO | Source: Ambulatory Visit | Attending: Family | Admitting: Family

## 2019-01-06 ENCOUNTER — Encounter: Payer: Self-pay | Admitting: Vascular Surgery

## 2019-01-06 VITALS — BP 196/103 | HR 59 | Temp 98.7°F | Resp 20 | Ht 61.0 in | Wt 200.3 lb

## 2019-01-06 DIAGNOSIS — I83811 Varicose veins of right lower extremities with pain: Secondary | ICD-10-CM | POA: Diagnosis not present

## 2019-01-06 DIAGNOSIS — I872 Venous insufficiency (chronic) (peripheral): Secondary | ICD-10-CM | POA: Insufficient documentation

## 2019-01-06 MED ORDER — TRAMADOL HCL 50 MG PO TABS: 50.0000 mg | ORAL_TABLET | Freq: Four times a day (QID) | ORAL | 0 refills | Status: AC | PRN

## 2019-01-06 MED ORDER — SILVER SULFADIAZINE 1 % EX CREA: 1.0000 "application " | TOPICAL_CREAM | Freq: Every day | CUTANEOUS | 0 refills | Status: AC

## 2019-01-06 NOTE — Progress Notes (Signed)
Patient name: Margaret Mcmahon MRN: [id number] DOB: December 29, 1960 Sex: female  REASON FOR VISIT:   Follow-up of varicose veins.  HPI:   Margaret Mcmahon is a pleasant 58 y.o. female who I last saw on 05/06/2018.  The patient had varicose veins of the right lower extremity.  The consult was requested by Dr. Katrinka Blazing.  Venous duplex scan at that time showed no evidence of DVT.  There was no deep venous reflux.  There was superficial venous reflux involving the great saphenous vein in the proximal thigh and calf.  Since I saw the patient last, she is developed significant pain in the left leg which began about 3 months ago.  In addition she has developed a wound on the lateral aspect of her left leg.  The pain is aggravated by standing.  She states that elevation does not help significantly.  She has been elevating her legs after the original visit and her symptoms in the right leg improved significantly.  She also has been wearing his compression stockings which helped.  She denies any history of long travel or immobility.  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  Past Medical History:  Diagnosis Date  . Acid reflux   . Benign essential hypertension   . Hyperlipemia, mixed   . Menopause   . Prediabetes     Family History  Problem Relation Age of Onset  . Cancer Mother   . Hypertension Mother     SOCIAL HISTORY: Social History   Tobacco Use  . Smoking status: Former Games developer  . Smokeless tobacco: Never Used  Substance Use Topics  . Alcohol use: Never    Frequency: Never    Allergies  Allergen Reactions  . Codeine Other (See Comments)    PT. REPORTS IT MAKES HER "FEEL HIGH" PT. REPORTS IT MAKES HER "FEEL HIGH"     Current Outpatient Medications  Medication Sig Dispense Refill  . albuterol (PROVENTIL HFA;VENTOLIN HFA) 108 (90 Base) MCG/ACT inhaler Inhale into the lungs 4 (four) times daily as needed for wheezing or shortness of breath.    Marland Kitchen aspirin EC 81 MG tablet Take 81  mg by mouth daily.    . cholecalciferol (VITAMIN D) 1000 units tablet Take 2,000 Units by mouth daily.    . furosemide (LASIX) 20 MG tablet Take 20 mg by mouth daily.    Marland Kitchen losartan (COZAAR) 50 MG tablet Take 50 mg by mouth daily.    . magnesium oxide (MAG-OX) 400 MG tablet Take 400 mg by mouth daily.    . metoprolol succinate (TOPROL-XL) 100 MG 24 hr tablet Take 100 mg by mouth daily. Take with or immediately following a meal.     No current facility-administered medications for this visit.     REVIEW OF SYSTEMS:  [X]  denotes positive finding, [ ]  denotes negative finding Cardiac  Comments:  Chest pain or chest pressure:    Shortness of breath upon exertion:    Short of breath when lying flat:    Irregular heart rhythm:        Vascular    Pain in calf, thigh, or hip brought on by ambulation:    Pain in feet at night that wakes you up from your sleep:     Blood clot in your veins:    Leg swelling:  x       Pulmonary    Oxygen at home:    Productive cough:     Wheezing:  Neurologic    Sudden weakness in arms or legs:     Sudden numbness in arms or legs:     Sudden onset of difficulty speaking or slurred speech:    Temporary loss of vision in one eye:     Problems with dizziness:         Gastrointestinal    Blood in stool:     Vomited blood:         Genitourinary    Burning when urinating:     Blood in urine:        Psychiatric    Major depression:         Hematologic    Bleeding problems:    Problems with blood clotting too easily:        Skin    Rashes or ulcers:        Constitutional    Fever or chills:     PHYSICAL EXAM:   Vitals:   01/06/19 0908  BP: (!) 196/103  Pulse: (!) 59  Resp: 20  Temp: 98.7 F (37.1 C)  SpO2: 99%  Weight: 200 lb 4.6 oz (90.9 kg)  Height:  (1.549 m)    GENERAL: The patient is a well-nourished female, in no acute distress. The vital signs are documented above. CARDIAC: There is a regular rate and rhythm.    VASCULAR: I do not detect carotid bruits. She has palpable femoral pulses and palpable dorsalis pedis pulses bilaterally. She has biphasic dorsalis pedis and posterior tibial signals bilaterally. VENOUS EXAM: She has hyperpigmentation bilaterally.  She has mild bilateral lower extremity swelling.  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  She has a wound on the lateral aspect of her leg above her lateral malleolus which measures 2-1/2 cm in width by 3 cm in length.  There is no significant drainage. I did look at the saphenous vein myself on the left with the SonoSite and I did not see any significant reflux in the left great saphenous vein.  The vein was not especially dilated. PULMONARY: There is good air exchange bilaterally without wheezing or rales. ABDOMEN: Soft and non-tender with normal pitched bowel sounds.  MUSCULOSKELETAL: There are no major deformities or cyanosis. NEUROLOGIC: No focal weakness or paresthesias are detected. SKIN: There are no ulcers or rashes noted. PSYCHIATRIC: The patient has a normal affect.  DATA:    VENOUS DUPLEX: I have independently interpreted her venous duplex scan today.  On the left side, which is the symptomatic side, there is no significant deep venous reflux or superficial venous reflux.  There is no evidence of DVT.  MEDICAL ISSUES:   CHRONIC VENOUS INSUFFICIENCY: This patient has a venous stasis ulcer of the lateral aspect of her left leg.  She also may have some phlebitis above this which explains her pain and symptoms.  We have discussed the importance of intermittent leg elevation the proper positioning for this.  At this point I do not think she can wear a compression stocking as this would be too tender.  Therefore we have done a dressing changes with Silvadene and instructed her how to apply to 4 inch Ace bandages to provide some mild compression.  I have given her prescription for Ultram for pain if she is having  significant pain.  We discussed potentially debriding the wound but for now I think it would be best to try the Silvadene dressing changes and follow this in the office.  I will plan  on seeing her back in 3 weeks.  Fortunately, she has no evidence of significant arterial insufficiency.  She is not a candidate for venous ablation of the saphenous vein on the left there is there is no significant reflux.  Waverly Ferrari [facility] [phone]

## 2019-01-27 ENCOUNTER — Ambulatory Visit: Payer: BC Managed Care – PPO | Admitting: Vascular Surgery

## 2019-02-17 ENCOUNTER — Ambulatory Visit: Payer: BC Managed Care – PPO | Admitting: Vascular Surgery

## 2019-09-10 ENCOUNTER — Other Ambulatory Visit: Payer: Self-pay

## 2019-09-10 DIAGNOSIS — Z20822 Contact with and (suspected) exposure to covid-19: Secondary | ICD-10-CM

## 2019-09-11 LAB — NOVEL CORONAVIRUS, NAA: SARS-CoV-2, NAA: NOT DETECTED

## 2019-09-20 ENCOUNTER — Other Ambulatory Visit: Payer: Self-pay

## 2019-09-20 DIAGNOSIS — Z20822 Contact with and (suspected) exposure to covid-19: Secondary | ICD-10-CM

## 2019-09-22 LAB — NOVEL CORONAVIRUS, NAA: SARS-CoV-2, NAA: NOT DETECTED

## 2022-06-29 LAB — GLUCOSE, POCT (MANUAL RESULT ENTRY): POC Glucose: 119 mg/dl — AB (ref 70–99)
